# Patient Record
Sex: Male | Born: 2000 | Race: Black or African American | Hispanic: No | Marital: Single | Smoking: Never smoker
Health system: Southern US, Community
[De-identification: ages and names within clinical notes are randomized; demographics above are authoritative.]

---
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
## 2019-04-26 ENCOUNTER — Other Ambulatory Visit: Payer: Self-pay | Admitting: Internal Medicine

## 2019-04-26 ENCOUNTER — Other Ambulatory Visit: Payer: Self-pay

## 2019-04-26 DIAGNOSIS — Z20822 Contact with and (suspected) exposure to covid-19: Secondary | ICD-10-CM

## 2019-05-01 LAB — NOVEL CORONAVIRUS, NAA: SARS-CoV-2, NAA: NOT DETECTED

## 2019-05-04 ENCOUNTER — Telehealth: Payer: Self-pay | Admitting: Pediatrics

## 2019-05-04 NOTE — Telephone Encounter (Signed)
Copied from [place] [id number]. Topic: General - Other >> May 04, 2019  5:05 PM Parke Poisson wrote: Reason for CRM: Rodney Cruise from [facility] is requesting that results of covid testing be mailed to [place] [address].Phone # [phone]

## 2019-05-05 NOTE — Telephone Encounter (Signed)
Results mailed to [address] [place] [address]

## 2019-10-22 ENCOUNTER — Encounter (HOSPITAL_COMMUNITY): Payer: Self-pay

## 2019-10-22 ENCOUNTER — Emergency Department (HOSPITAL_COMMUNITY): Payer: No Typology Code available for payment source

## 2019-10-22 ENCOUNTER — Other Ambulatory Visit: Payer: Self-pay

## 2019-10-22 ENCOUNTER — Emergency Department (HOSPITAL_COMMUNITY)
Admission: EM | Admit: 2019-10-22 | Discharge: 2019-10-22 | Disposition: A | Discharge status: Home / Self Care | Payer: No Typology Code available for payment source | Attending: Emergency Medicine | Admitting: Emergency Medicine

## 2019-10-22 DIAGNOSIS — S300XXA Contusion of lower back and pelvis, initial encounter: Secondary | ICD-10-CM | POA: Diagnosis not present

## 2019-10-22 DIAGNOSIS — R5381 Other malaise: Secondary | ICD-10-CM | POA: Diagnosis not present

## 2019-10-22 DIAGNOSIS — Y9389 Activity, other specified: Secondary | ICD-10-CM | POA: Insufficient documentation

## 2019-10-22 DIAGNOSIS — Y999 Unspecified external cause status: Secondary | ICD-10-CM | POA: Insufficient documentation

## 2019-10-22 DIAGNOSIS — Y92410 Unspecified street and highway as the place of occurrence of the external cause: Secondary | ICD-10-CM | POA: Diagnosis not present

## 2019-10-22 DIAGNOSIS — Z79899 Other long term (current) drug therapy: Secondary | ICD-10-CM | POA: Diagnosis not present

## 2019-10-22 DIAGNOSIS — S79911A Unspecified injury of right hip, initial encounter: Secondary | ICD-10-CM | POA: Diagnosis not present

## 2019-10-22 DIAGNOSIS — S3993XA Unspecified injury of pelvis, initial encounter: Secondary | ICD-10-CM | POA: Diagnosis present

## 2019-10-22 DIAGNOSIS — M25551 Pain in right hip: Secondary | ICD-10-CM | POA: Diagnosis not present

## 2019-10-22 MED ORDER — NAPROXEN 500 MG PO TABS: 500.0000 mg | ORAL_TABLET | Freq: Two times a day (BID) | ORAL | 0 refills | Status: AC

## 2019-10-22 MED ORDER — OXYCODONE-ACETAMINOPHEN 5-325 MG PO TABS
1.0000 | ORAL_TABLET | Freq: Once | ORAL | Status: AC
  Administered 2019-10-22: 03:00:00 1 via ORAL
  Filled 2019-10-22: qty 1
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
## 2019-12-21 ENCOUNTER — Ambulatory Visit: Payer: No Typology Code available for payment source | Attending: Internal Medicine
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
## 2022-08-14 ENCOUNTER — Emergency Department (HOSPITAL_COMMUNITY): Payer: Self-pay

## 2022-08-14 ENCOUNTER — Other Ambulatory Visit: Payer: Self-pay

## 2022-08-14 ENCOUNTER — Emergency Department (HOSPITAL_COMMUNITY)
Admission: EM | Admit: 2022-08-14 | Discharge: 2022-08-14 | Disposition: A | Discharge status: Home / Self Care | Payer: Self-pay | Attending: Emergency Medicine | Admitting: Emergency Medicine

## 2022-08-14 ENCOUNTER — Encounter (HOSPITAL_COMMUNITY): Payer: Self-pay

## 2022-08-14 DIAGNOSIS — S01111A Laceration without foreign body of right eyelid and periocular area, initial encounter: Secondary | ICD-10-CM | POA: Diagnosis not present

## 2022-08-14 DIAGNOSIS — R55 Syncope and collapse: Secondary | ICD-10-CM | POA: Insufficient documentation

## 2022-08-14 DIAGNOSIS — R519 Headache, unspecified: Secondary | ICD-10-CM | POA: Diagnosis not present

## 2022-08-14 DIAGNOSIS — Y92410 Unspecified street and highway as the place of occurrence of the external cause: Secondary | ICD-10-CM | POA: Diagnosis not present

## 2022-08-14 DIAGNOSIS — R413 Other amnesia: Secondary | ICD-10-CM | POA: Diagnosis not present

## 2022-08-14 DIAGNOSIS — Z79899 Other long term (current) drug therapy: Secondary | ICD-10-CM | POA: Insufficient documentation

## 2022-08-14 DIAGNOSIS — S00201A Unspecified superficial injury of right eyelid and periocular area, initial encounter: Secondary | ICD-10-CM | POA: Diagnosis present

## 2022-08-14 DIAGNOSIS — Z23 Encounter for immunization: Secondary | ICD-10-CM | POA: Insufficient documentation

## 2022-08-14 LAB — COMPREHENSIVE METABOLIC PANEL
ALT: 15 U/L (ref 0–44)
AST: 24 U/L (ref 15–41)
Albumin: 3.8 g/dL (ref 3.5–5.0)
Alkaline Phosphatase: 74 U/L (ref 38–126)
Anion gap: 11 (ref 5–15)
BUN: 8 mg/dL (ref 6–20)
CO2: 27 mmol/L (ref 22–32)
Calcium: 9.5 mg/dL (ref 8.9–10.3)
Chloride: 103 mmol/L (ref 98–111)
Creatinine, Ser: 1.38 mg/dL — ABNORMAL HIGH (ref 0.61–1.24)
GFR, Estimated: >60 mL/min (ref >60)
Glucose, Bld: 132 mg/dL — ABNORMAL HIGH (ref 70–99)
Potassium: 3.4 mmol/L — ABNORMAL LOW (ref 3.5–5.1)
Sodium: 141 mmol/L (ref 135–145)
Total Bilirubin: 0.4 mg/dL (ref 0.3–1.2)
Total Protein: 7.1 g/dL (ref 6.5–8.1)

## 2022-08-14 LAB — CBC WITH DIFFERENTIAL/PLATELET
Abs Immature Granulocytes: 0.02 10*3/uL (ref 0.00–0.07)
Basophils Absolute: 0 10*3/uL (ref 0.0–0.1)
Basophils Relative: 1 %
Eosinophils Absolute: 0.1 10*3/uL (ref 0.0–0.5)
Eosinophils Relative: 2 %
HCT: 41.9 % (ref 39.0–52.0)
Hemoglobin: 13.8 g/dL (ref 13.0–17.0)
Immature Granulocytes: 0 %
Lymphocytes Relative: 22 %
Lymphs Abs: 1.3 10*3/uL (ref 0.7–4.0)
MCH: 23.5 pg — ABNORMAL LOW (ref 26.0–34.0)
MCHC: 32.9 g/dL (ref 30.0–36.0)
MCV: 71.3 fL — ABNORMAL LOW (ref 80.0–100.0)
Monocytes Absolute: 0.5 10*3/uL (ref 0.1–1.0)
Monocytes Relative: 9 %
Neutro Abs: 4.1 10*3/uL (ref 1.7–7.7)
Neutrophils Relative %: 66 %
Platelets: 210 10*3/uL (ref 150–400)
RBC: 5.88 MIL/uL — ABNORMAL HIGH (ref 4.22–5.81)
RDW: 15.4 % (ref 11.5–15.5)
WBC: 6 10*3/uL (ref 4.0–10.5)
nRBC: 0 % (ref 0.0–0.2)

## 2022-08-14 LAB — RAPID URINE DRUG SCREEN, HOSP PERFORMED
Amphetamines: NOT DETECTED
Barbiturates: NOT DETECTED
Benzodiazepines: NOT DETECTED
Cocaine: NOT DETECTED
Opiates: POSITIVE — AB
Tetrahydrocannabinol: POSITIVE — AB

## 2022-08-14 LAB — ETHANOL: Alcohol, Ethyl (B): <10 mg/dL (ref <10)

## 2022-08-14 MED ORDER — TETANUS-DIPHTH-ACELL PERTUSSIS 5-2.5-18.5 LF-MCG/0.5 IM SUSY
0.5000 mL | PREFILLED_SYRINGE | Freq: Once | INTRAMUSCULAR | Status: AC
  Administered 2022-08-14: 0.5 mL via INTRAMUSCULAR
  Filled 2022-08-14: qty 0.5

## 2022-08-14 MED ORDER — LIDOCAINE HCL 2 % IJ SOLN
10.0000 mL | Freq: Once | INTRAMUSCULAR | Status: AC
  Administered 2022-08-14: 200 mg
  Filled 2022-08-14: qty 20

## 2022-08-14 MED ORDER — ONDANSETRON HCL 4 MG/2ML IJ SOLN
4.0000 mg | Freq: Once | INTRAMUSCULAR | Status: AC
  Administered 2022-08-14: 4 mg via INTRAVENOUS
  Filled 2022-08-14: qty 2

## 2022-08-14 MED ORDER — MORPHINE SULFATE (PF) 4 MG/ML IV SOLN
4.0000 mg | Freq: Once | INTRAVENOUS | Status: AC
  Administered 2022-08-14: 4 mg via INTRAVENOUS
  Filled 2022-08-14: qty 1

## 2022-08-14 MED ORDER — SODIUM CHLORIDE 0.9 % IV BOLUS
1000.0000 mL | Freq: Once | INTRAVENOUS | Status: AC
  Administered 2022-08-14: 1000 mL via INTRAVENOUS

## 2022-08-14 MED ORDER — HYDROCODONE-ACETAMINOPHEN 5-325 MG PO TABS: 1.0000 | ORAL_TABLET | Freq: Four times a day (QID) | ORAL | 0 refills | Status: AC | PRN

## 2022-08-14 NOTE — ED Notes (Signed)
Patient notified of need for a urine sample at this time
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
## 2022-08-14 NOTE — Discharge Instructions (Addendum)
Suture removal in a week  You are expected to have eye swelling and pain.  Apply ice on your face  Take Tylenol or Motrin for pain and Vicodin for severe pain  Follow-up with your doctor  Return to ER if you have worse eye pain, headache, vomiting

## 2022-08-14 NOTE — ED Notes (Signed)
PA and MD at bedside at this time to assess the patient

## 2022-08-14 NOTE — ED Notes (Signed)
Patient transported to CT 
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
## 2022-08-14 NOTE — ED Notes (Signed)
Patient lab work sent at this time

## 2022-08-20 ENCOUNTER — Ambulatory Visit: Payer: Self-pay

## 2022-11-06 ENCOUNTER — Emergency Department (HOSPITAL_COMMUNITY): Payer: Self-pay

## 2022-11-06 ENCOUNTER — Emergency Department (HOSPITAL_COMMUNITY)
Admission: EM | Admit: 2022-11-06 | Discharge: 2022-11-07 | Discharge status: Against Medical Advice | Payer: Self-pay | Attending: Emergency Medicine | Admitting: Emergency Medicine

## 2022-11-06 ENCOUNTER — Encounter (HOSPITAL_COMMUNITY): Payer: Self-pay

## 2022-11-06 ENCOUNTER — Other Ambulatory Visit: Payer: Self-pay

## 2022-11-06 DIAGNOSIS — Z5321 Procedure and treatment not carried out due to patient leaving prior to being seen by health care provider: Secondary | ICD-10-CM | POA: Insufficient documentation

## 2022-11-06 DIAGNOSIS — R6883 Chills (without fever): Secondary | ICD-10-CM | POA: Insufficient documentation

## 2022-11-06 DIAGNOSIS — R112 Nausea with vomiting, unspecified: Secondary | ICD-10-CM | POA: Insufficient documentation

## 2022-11-06 DIAGNOSIS — R1031 Right lower quadrant pain: Secondary | ICD-10-CM | POA: Insufficient documentation

## 2022-11-06 LAB — COMPREHENSIVE METABOLIC PANEL
ALT: 17 U/L (ref 0–44)
AST: 20 U/L (ref 15–41)
Albumin: 4.8 g/dL (ref 3.5–5.0)
Alkaline Phosphatase: 79 U/L (ref 38–126)
Anion gap: 13 (ref 5–15)
BUN: 7 mg/dL (ref 6–20)
CO2: 27 mmol/L (ref 22–32)
Calcium: 10.1 mg/dL (ref 8.9–10.3)
Chloride: 96 mmol/L — ABNORMAL LOW (ref 98–111)
Creatinine, Ser: 1.08 mg/dL (ref 0.61–1.24)
GFR, Estimated: >60 mL/min (ref >60)
Glucose, Bld: 109 mg/dL — ABNORMAL HIGH (ref 70–99)
Potassium: 4 mmol/L (ref 3.5–5.1)
Sodium: 136 mmol/L (ref 135–145)
Total Bilirubin: 1.1 mg/dL (ref 0.3–1.2)
Total Protein: 8.1 g/dL (ref 6.5–8.1)

## 2022-11-06 LAB — CBC WITH DIFFERENTIAL/PLATELET
Abs Immature Granulocytes: 0.02 10*3/uL (ref 0.00–0.07)
Basophils Absolute: 0 10*3/uL (ref 0.0–0.1)
Basophils Relative: 0 %
Eosinophils Absolute: 0 10*3/uL (ref 0.0–0.5)
Eosinophils Relative: 0 %
HCT: 46.5 % (ref 39.0–52.0)
Hemoglobin: 15.2 g/dL (ref 13.0–17.0)
Immature Granulocytes: 0 %
Lymphocytes Relative: 13 %
Lymphs Abs: 0.9 10*3/uL (ref 0.7–4.0)
MCH: 22.8 pg — ABNORMAL LOW (ref 26.0–34.0)
MCHC: 32.7 g/dL (ref 30.0–36.0)
MCV: 69.7 fL — ABNORMAL LOW (ref 80.0–100.0)
Monocytes Absolute: 0.3 10*3/uL (ref 0.1–1.0)
Monocytes Relative: 4 %
Neutro Abs: 5.8 10*3/uL (ref 1.7–7.7)
Neutrophils Relative %: 83 %
Platelets: 294 10*3/uL (ref 150–400)
RBC: 6.67 MIL/uL — ABNORMAL HIGH (ref 4.22–5.81)
RDW: 16.6 % — ABNORMAL HIGH (ref 11.5–15.5)
WBC: 7.1 10*3/uL (ref 4.0–10.5)
nRBC: 0 % (ref 0.0–0.2)

## 2022-11-06 LAB — I-STAT CHEM 8, ED
BUN: 7 mg/dL (ref 6–20)
Calcium, Ion: 1.19 mmol/L (ref 1.15–1.40)
Chloride: 97 mmol/L — ABNORMAL LOW (ref 98–111)
Creatinine, Ser: 1 mg/dL (ref 0.61–1.24)
Glucose, Bld: 103 mg/dL — ABNORMAL HIGH (ref 70–99)
HCT: 48 % (ref 39.0–52.0)
Hemoglobin: 16.3 g/dL (ref 13.0–17.0)
Potassium: 4.3 mmol/L (ref 3.5–5.1)
Sodium: 137 mmol/L (ref 135–145)
TCO2: 28 mmol/L (ref 22–32)

## 2022-11-06 LAB — URINALYSIS, ROUTINE W REFLEX MICROSCOPIC
Bilirubin Urine: NEGATIVE
Glucose, UA: NEGATIVE mg/dL
Hgb urine dipstick: NEGATIVE
Ketones, ur: 5 mg/dL — AB
Leukocytes,Ua: NEGATIVE
Nitrite: NEGATIVE
Protein, ur: NEGATIVE mg/dL
Specific Gravity, Urine: 1.011 (ref 1.005–1.030)
pH: 7 (ref 5.0–8.0)

## 2022-11-06 LAB — LIPASE, BLOOD: Lipase: 47 U/L (ref 11–51)

## 2022-11-06 MED ORDER — FENTANYL CITRATE PF 50 MCG/ML IJ SOSY
50.0000 ug | PREFILLED_SYRINGE | Freq: Once | INTRAMUSCULAR | Status: AC
  Administered 2022-11-06: 50 ug via INTRAVENOUS
  Filled 2022-11-06: qty 1

## 2022-11-06 MED ORDER — IOHEXOL 350 MG/ML SOLN
75.0000 mL | Freq: Once | INTRAVENOUS | Status: AC | PRN
  Administered 2022-11-06: 75 mL via INTRAVENOUS

## 2022-11-06 MED ORDER — ONDANSETRON HCL 4 MG/2ML IJ SOLN
4.0000 mg | Freq: Once | INTRAMUSCULAR | Status: AC
  Administered 2022-11-06: 4 mg via INTRAVENOUS
  Filled 2022-11-06: qty 2

## 2022-11-06 NOTE — ED Provider Triage Note (Signed)
Emergency Medicine Provider Triage Evaluation Note  Craig Morris , a 22 y.o. male  was evaluated in triage.  Pt complains of RLQ abdominal pain for the past 4-5 days that has been worsening.  He states no medications have helped.  Has associated chills, nausea, vomiting, and no bowel movement other than passing some "water" in the last 5 days despite using laxatives.  No hx of abdominal surgeries.   Review of Systems  Positive: As above Negative: As above  Physical Exam  BP (!) 153/97   Pulse 73   Temp 98.1 F (36.7 C)   Resp 17   Ht 5\' 10"  (1.778 m)   Wt 70.3 kg   SpO2 100%   BMI 22.24 kg/m  Gen:   Awake, no distress  Resp:  Normal effort  MSK:   Moves extremities without difficulty  Other:  RLQ tender to palpation with guarding  Medical Decision Making  Medically screening exam initiated at 6:06 PM.  Appropriate orders placed.  Craig Morris was informed that the remainder of the evaluation will be completed by another provider, this initial triage assessment does not replace that evaluation, and the importance of remaining in the ED until their evaluation is complete.     Theressa Stamps R, [place] 11/06/22 [phone]

## 2022-11-06 NOTE — ED Triage Notes (Incomplete)
THE PT HAS BEEN CALLED X2 BY THE C-T STAFF  THEY CANNOT LOCATE HIM

## 2022-11-06 NOTE — ED Notes (Signed)
Called for vitals. No response 

## 2022-11-06 NOTE — ED Triage Notes (Signed)
Pt came in POV d/t constant Rt sided abd pain for the past 5 days. States he has been seen for this & no meds have helped or stopped it. Describes the pain as a burning/stabbing sensation. Now has body chills, n/v, constant thirst, & lastly reports no BM the last 5 days WITH trying laxatives to relieve that. A/Ox4 in triage, denies any fevers.

## 2022-11-07 NOTE — ED Notes (Signed)
PT asked to have catheter removed . Stated wait was terrible and left

## 2024-08-15 ENCOUNTER — Other Ambulatory Visit: Payer: Self-pay

## 2024-08-15 ENCOUNTER — Encounter (HOSPITAL_COMMUNITY): Payer: Self-pay | Admitting: Emergency Medicine

## 2024-08-15 ENCOUNTER — Emergency Department (HOSPITAL_COMMUNITY)
Admission: EM | Admit: 2024-08-15 | Discharge: 2024-08-15 | Disposition: A | Discharge status: Home / Self Care | Payer: Self-pay | Attending: Emergency Medicine | Admitting: Emergency Medicine

## 2024-08-15 DIAGNOSIS — Z202 Contact with and (suspected) exposure to infections with a predominantly sexual mode of transmission: Secondary | ICD-10-CM | POA: Insufficient documentation

## 2024-08-15 LAB — HIV ANTIBODY (ROUTINE TESTING W REFLEX): HIV Screen 4th Generation wRfx: NONREACTIVE

## 2024-08-15 MED ORDER — DOXYCYCLINE HYCLATE 100 MG PO TABS
100.0000 mg | ORAL_TABLET | Freq: Once | ORAL | Status: AC
  Administered 2024-08-15: 100 mg via ORAL
  Filled 2024-08-15: qty 1

## 2024-08-15 MED ORDER — CEFTRIAXONE SODIUM 500 MG IJ SOLR
500.0000 mg | Freq: Once | INTRAMUSCULAR | Status: AC
  Administered 2024-08-15: 500 mg via INTRAMUSCULAR
  Filled 2024-08-15: qty 500

## 2024-08-15 MED ORDER — EMTRICITABINE-TENOFOVIR AF 200-25 MG PO TABS
1.0000 | ORAL_TABLET | ORAL | Status: AC
  Administered 2024-08-15: 1 via ORAL
  Filled 2024-08-15: qty 1

## 2024-08-15 MED ORDER — DOLUTEGRAVIR SODIUM 50 MG PO TABS: 50.0000 mg | ORAL_TABLET | Freq: Every day | ORAL | 0 refills | Status: AC

## 2024-08-15 MED ORDER — EMTRICITABINE-TENOFOVIR DF 200-300 MG PO TABS: 1.0000 | ORAL_TABLET | Freq: Every day | ORAL | 0 refills | Status: AC

## 2024-08-15 MED ORDER — DOLUTEGRAVIR SODIUM 50 MG PO TABS
50.0000 mg | ORAL_TABLET | ORAL | Status: AC
  Administered 2024-08-15: 50 mg via ORAL
  Filled 2024-08-15: qty 1

## 2024-08-15 MED ORDER — DOXYCYCLINE HYCLATE 100 MG PO CAPS: 100.0000 mg | ORAL_CAPSULE | Freq: Two times a day (BID) | ORAL | 0 refills | Status: AC

## 2024-08-15 NOTE — ED Provider Notes (Signed)
 [place] [facility] AT [facility] Provider Note   CSN: [id number] Arrival date & time: 08/15/24  0047     Patient presents with: SEXUALLY TRANSMITTED DISEASE   Craig Morris is a 23 y.o. male.   Patient presents to the urgency department concerned about STD.  Patient reports that he had unprotected vaginal intercourse last night.  He has only known the person for a couple of weeks, reports that she was acting weird but he is unaware of any STD diagnosis in her.  He is concerned that he might contract something and wants treatment for everything.       Prior to Admission medications   Medication Sig Start Date End Date Taking? Authorizing Provider  dolutegravir (TIVICAY) 50 MG tablet Take 1 tablet (50 mg total) by mouth daily. 08/15/24  Yes Decarlo Rivet, Lonni PARAS, MD  doxycycline (VIBRAMYCIN) 100 MG capsule Take 1 capsule (100 mg total) by mouth 2 (two) times daily. 08/15/24  Yes Philip Kotlyar, Lonni PARAS, MD  emtricitabine-tenofovir (TRUVADA) 200-300 MG tablet Take 1 tablet by mouth daily. 08/15/24  Yes Bernadett Milian, Lonni PARAS, MD  acetaminophen  (TYLENOL ) 500 MG tablet Take 500 mg by mouth every 6 (six) hours as needed for moderate pain.    [provider]  HYDROcodone -acetaminophen  (NORCO/VICODIN) 5-325 MG tablet Take 1 tablet by mouth every 6 (six) hours as needed. 08/14/22   Patt Alm Macho, MD  loratadine (CLARITIN) 10 MG tablet Take 10 mg by mouth daily.    [provider]  naproxen  (NAPROSYN ) 500 MG tablet Take 1 tablet (500 mg total) by mouth 2 (two) times daily. Patient taking differently: Take 500 mg by mouth daily as needed for mild pain. 10/22/19   Horton, Charmaine FALCON, MD    Allergies: Patient has no known allergies.    Review of Systems  Updated Vital Signs BP (!) 144/98 (BP Location: Left Arm)   Pulse 88   Temp 99.5 F (37.5 C) (Oral)   Resp 16   Ht 5' 10 (1.778 m)   Wt 68.5 kg   SpO2 98%   BMI 21.67 kg/m    Physical Exam Vitals and nursing note reviewed.  Constitutional:      General: He is not in acute distress.    Appearance: He is well-developed.  HENT:     Head: Normocephalic and atraumatic.     Mouth/Throat:     Mouth: Mucous membranes are moist.  Eyes:     General: Vision grossly intact. Gaze aligned appropriately.     Extraocular Movements: Extraocular movements intact.     Conjunctiva/sclera: Conjunctivae normal.  Cardiovascular:     Rate and Rhythm: Normal rate and regular rhythm.     Pulses: Normal pulses.     Heart sounds: Normal heart sounds, S1 normal and S2 normal. No murmur heard.    No friction rub. No gallop.  Pulmonary:     Effort: Pulmonary effort is normal. No respiratory distress.     Breath sounds: Normal breath sounds.  Abdominal:     Palpations: Abdomen is soft.     Tenderness: There is no abdominal tenderness. There is no guarding or rebound.     Hernia: No hernia is present.  Musculoskeletal:        General: No swelling.     Cervical back: Full passive range of motion without pain, normal range of motion and neck supple. No pain with movement, spinous process tenderness or muscular tenderness. Normal range of motion.  Right lower leg: No edema.     Left lower leg: No edema.  Skin:    General: Skin is warm and dry.     Capillary Refill: Capillary refill takes less than 2 seconds.     Findings: No ecchymosis, erythema, lesion or wound.  Neurological:     Mental Status: He is alert and oriented to person, place, and time.     GCS: GCS eye subscore is 4. GCS verbal subscore is 5. GCS motor subscore is 6.     Cranial Nerves: Cranial nerves 2-12 are intact.     Sensory: Sensation is intact.     Motor: Motor function is intact. No weakness or abnormal muscle tone.     Coordination: Coordination is intact.  Psychiatric:        Mood and Affect: Mood normal.        Speech: Speech normal.        Behavior: Behavior normal.     (all labs ordered are  listed, but only abnormal results are displayed) Labs Reviewed  RPR  HIV ANTIBODY (ROUTINE TESTING W REFLEX)  GC/CHLAMYDIA PROBE AMP ([place]) NOT AT [facility]    EKG: None  Radiology: No results found.   Procedures   Medications Ordered in the ED  cefTRIAXone (ROCEPHIN) injection 500 mg (has no administration in time range)  doxycycline (VIBRA-TABS) tablet 100 mg (has no administration in time range)  emtricitabine-tenofovir AF (DESCOVY) 200-25 MG per tablet 1 tablet (has no administration in time range)  dolutegravir (TIVICAY) tablet 50 mg (has no administration in time range)                                    Medical Decision Making Amount and/or Complexity of Data Reviewed Labs: ordered.  Risk Prescription drug management.   Discussed the low likelihood of transmission of HIV with this contact but he wishes to initiate PEP.  First dose provided.  GC chlamydia coverage provided as well.  Testing sent.  Patient instructed that he needs repeat testing in 6 weeks.  For the [facility] department for repeat testing.     Final diagnoses:  Possible exposure to STD    ED Discharge Orders          Ordered    doxycycline (VIBRAMYCIN) 100 MG capsule  2 times daily        08/15/24 0132    emtricitabine-tenofovir (TRUVADA) 200-300 MG tablet  Daily        08/15/24 0132    dolutegravir (TIVICAY) 50 MG tablet  Daily        08/15/24 0132               Haze Lonni PARAS, MD 08/15/24 [id number]

## 2024-08-15 NOTE — ED Notes (Signed)
Called ac for meds  

## 2024-08-15 NOTE — ED Triage Notes (Signed)
 Pt here for STD check. Has been having unprotected sex. Denies any symptoms.

## 2024-08-15 NOTE — ED Notes (Signed)
 Lab at bedside

## 2024-08-15 NOTE — Discharge Instructions (Signed)
 You need to follow-up at the health department in 6 weeks for repeat testing.

## 2024-08-16 LAB — GC/CHLAMYDIA PROBE AMP (~~LOC~~) NOT AT ARMC
Chlamydia: NEGATIVE
Comment: NEGATIVE
Comment: NORMAL
Neisseria Gonorrhea: NEGATIVE

## 2024-08-16 LAB — RPR: RPR Ser Ql: NONREACTIVE
